# Patient Record
Sex: Female | Born: 1980 | Race: Black or African American | Hispanic: No | Marital: Single | Smoking: Current every day smoker
Health system: Southern US, Community
[De-identification: ages and names within clinical notes are randomized; demographics above are authoritative.]

## PROBLEM LIST (undated history)

## (undated) DIAGNOSIS — M5430 Sciatica, unspecified side: Secondary | ICD-10-CM

## (undated) DIAGNOSIS — I10 Essential (primary) hypertension: Secondary | ICD-10-CM

## (undated) DIAGNOSIS — L02416 Cutaneous abscess of left lower limb: Secondary | ICD-10-CM

## (undated) HISTORY — PX: KNEE SURGERY: SHX244

---

## 2017-02-05 DIAGNOSIS — M5442 Lumbago with sciatica, left side: Secondary | ICD-10-CM | POA: Insufficient documentation

## 2017-02-05 DIAGNOSIS — G8929 Other chronic pain: Secondary | ICD-10-CM | POA: Insufficient documentation

## 2019-08-15 DIAGNOSIS — M25562 Pain in left knee: Secondary | ICD-10-CM | POA: Insufficient documentation

## 2019-08-15 DIAGNOSIS — L02416 Cutaneous abscess of left lower limb: Secondary | ICD-10-CM | POA: Insufficient documentation

## 2019-08-21 DIAGNOSIS — L02415 Cutaneous abscess of right lower limb: Secondary | ICD-10-CM | POA: Insufficient documentation

## 2020-02-02 DIAGNOSIS — R200 Anesthesia of skin: Secondary | ICD-10-CM | POA: Insufficient documentation

## 2020-08-18 ENCOUNTER — Other Ambulatory Visit: Payer: Self-pay

## 2020-08-18 ENCOUNTER — Encounter (HOSPITAL_BASED_OUTPATIENT_CLINIC_OR_DEPARTMENT_OTHER): Payer: Self-pay | Admitting: Emergency Medicine

## 2020-08-18 ENCOUNTER — Emergency Department (HOSPITAL_BASED_OUTPATIENT_CLINIC_OR_DEPARTMENT_OTHER)
Admission: EM | Admit: 2020-08-18 | Discharge: 2020-08-18 | Disposition: A | Discharge status: Home / Self Care | Payer: Medicaid Other | Attending: Emergency Medicine | Admitting: Emergency Medicine

## 2020-08-18 ENCOUNTER — Emergency Department (HOSPITAL_BASED_OUTPATIENT_CLINIC_OR_DEPARTMENT_OTHER): Payer: Medicaid Other

## 2020-08-18 DIAGNOSIS — F17210 Nicotine dependence, cigarettes, uncomplicated: Secondary | ICD-10-CM | POA: Insufficient documentation

## 2020-08-18 DIAGNOSIS — R0789 Other chest pain: Secondary | ICD-10-CM | POA: Insufficient documentation

## 2020-08-18 DIAGNOSIS — R079 Chest pain, unspecified: Secondary | ICD-10-CM

## 2020-08-18 HISTORY — DX: Cutaneous abscess of left lower limb: L02.416

## 2020-08-18 LAB — CBC WITH DIFFERENTIAL/PLATELET
Abs Immature Granulocytes: 0.01 10*3/uL (ref 0.00–0.07)
Basophils Absolute: 0 10*3/uL (ref 0.0–0.1)
Basophils Relative: 0 %
Eosinophils Absolute: 0.4 10*3/uL (ref 0.0–0.5)
Eosinophils Relative: 7 %
HCT: 31.4 % — ABNORMAL LOW (ref 36.0–46.0)
Hemoglobin: 9.1 g/dL — ABNORMAL LOW (ref 12.0–15.0)
Immature Granulocytes: 0 %
Lymphocytes Relative: 18 %
Lymphs Abs: 1 10*3/uL (ref 0.7–4.0)
MCH: 20.5 pg — ABNORMAL LOW (ref 26.0–34.0)
MCHC: 29 g/dL — ABNORMAL LOW (ref 30.0–36.0)
MCV: 70.9 fL — ABNORMAL LOW (ref 80.0–100.0)
Monocytes Absolute: 0.6 10*3/uL (ref 0.1–1.0)
Monocytes Relative: 10 %
Neutro Abs: 3.6 10*3/uL (ref 1.7–7.7)
Neutrophils Relative %: 65 %
Platelets: 511 10*3/uL — ABNORMAL HIGH (ref 150–400)
RBC: 4.43 MIL/uL (ref 3.87–5.11)
RDW: 18.7 % — ABNORMAL HIGH (ref 11.5–15.5)
WBC: 5.5 10*3/uL (ref 4.0–10.5)
nRBC: 0 % (ref 0.0–0.2)

## 2020-08-18 LAB — BASIC METABOLIC PANEL
Anion gap: 8 (ref 5–15)
BUN: 9 mg/dL (ref 6–20)
CO2: 26 mmol/L (ref 22–32)
Calcium: 8.6 mg/dL — ABNORMAL LOW (ref 8.9–10.3)
Chloride: 105 mmol/L (ref 98–111)
Creatinine, Ser: 0.76 mg/dL (ref 0.44–1.00)
GFR, Estimated: >60 mL/min (ref >60)
Glucose, Bld: 91 mg/dL (ref 70–99)
Potassium: 3.2 mmol/L — ABNORMAL LOW (ref 3.5–5.1)
Sodium: 139 mmol/L (ref 135–145)

## 2020-08-18 LAB — TROPONIN I (HIGH SENSITIVITY): Troponin I (High Sensitivity): 5 ng/L (ref <18)

## 2020-08-18 MED ORDER — KETOROLAC TROMETHAMINE 30 MG/ML IJ SOLN
30.0000 mg | Freq: Once | INTRAMUSCULAR | Status: AC
  Administered 2020-08-18: 30 mg via INTRAVENOUS
  Filled 2020-08-18: qty 1

## 2020-08-18 MED ORDER — POTASSIUM CHLORIDE CRYS ER 20 MEQ PO TBCR
20.0000 meq | EXTENDED_RELEASE_TABLET | Freq: Once | ORAL | Status: AC
  Administered 2020-08-18: 20 meq via ORAL
  Filled 2020-08-18: qty 1

## 2020-08-18 NOTE — ED Triage Notes (Signed)
Pt reports left sided chest pain for last week. Pt reports pain is worse with movement. Nad noted.

## 2020-08-18 NOTE — ED Provider Notes (Signed)
[facility] EMERGENCY DEPARTMENT Provider Note   CSN: [id number] Arrival date & time: 08/18/20  0845     History Chief Complaint  Patient presents with   Chest Pain    Michelle Duncan is a 40 y.o. female.  She is here with complaint of left lateral chest pain worse with movement and taking a deep breath.  Denies short of breath nausea or vomiting.  No prior history of cardiac disease.  No known trauma.  She does admit to tobacco and cocaine.  Does have family history of cardiac disease in her mother.  The history is provided by the patient.  Chest Pain Pain location:  L lateral chest Pain quality: sharp and stabbing   Pain radiates to:  Does not radiate Pain severity:  Severe Onset quality:  Gradual Duration:  1 week Timing:  Intermittent Progression:  Unchanged Chronicity:  New Context: raising an arm   Relieved by:  None tried Worsened by:  Movement Ineffective treatments:  None tried Associated symptoms: no abdominal pain, no back pain, no cough, no diaphoresis, no fever, no headache, no nausea, no shortness of breath and no vomiting   Risk factors: smoking       Past Medical History:  Diagnosis Date   Abscess of left leg     Patient Active Problem List   Diagnosis Date Noted   Right leg numbness 02/02/2020   Abscess of leg, right 08/21/2019   Abscess of left lower leg 08/15/2019   Left knee pain 08/15/2019   Chronic left-sided low back pain with left-sided sciatica 02/05/2017    History reviewed. No pertinent surgical history.   OB History   No obstetric history on file.     History reviewed. No pertinent family history.  Social History   Tobacco Use   Smoking status: Every Day    Packs/day: 0.50    Types: Cigarettes   Smokeless tobacco: Never  Vaping Use   Vaping Use: Never used  Substance Use Topics   Alcohol use: Not Currently   Drug use: Yes    Types: Cocaine    Comment: last use 08/17/20    Home Medications Prior to  Admission medications   Medication Sig Start Date End Date Taking? Authorizing Provider  lisinopril-hydrochlorothiazide (ZESTORETIC) 10-12.5 MG tablet Take 1 tablet by mouth daily. 05/22/16   [provider]  sertraline (ZOLOFT) 50 MG tablet TAKE ONE AND ONE-HALF TABLETS BY MOUTH ONE TIME DAILY AT BEDTIME DO NOT DRIVE OR OPERATE MACHINERY IF MEDICATION CAUSES DROWSINESS DO NOT [place] 08/04/19   [provider]    Allergies    Hydrocodone-acetaminophen  Review of Systems   Review of Systems  Constitutional:  Negative for diaphoresis and fever.  HENT:  Negative for sore throat.   Eyes:  Negative for visual disturbance.  Respiratory:  Negative for cough and shortness of breath.   Cardiovascular:  Positive for chest pain.  Gastrointestinal:  Negative for abdominal pain, nausea and vomiting.  Genitourinary:  Negative for dysuria.  Musculoskeletal:  Negative for back pain.  Skin:  Negative for rash.  Neurological:  Negative for headaches.   Physical Exam Updated Vital Signs BP (!) 168/102 (BP Location: Right Arm)   Pulse 81   Temp 98.8 F (37.1 C) (Oral)   Resp (!) 21   Ht 5\' 10"  (1.778 m)   Wt 99.8 kg   LMP 08/18/2020 (Exact Date)   SpO2 100%   BMI 31.57 kg/m   Physical Exam Vitals and nursing note  reviewed.  Constitutional:      General: She is not in acute distress.    Appearance: She is well-developed.  HENT:     Head: Normocephalic and atraumatic.  Eyes:     Conjunctiva/sclera: Conjunctivae normal.  Cardiovascular:     Rate and Rhythm: Normal rate and regular rhythm.     Heart sounds: No murmur heard. Pulmonary:     Effort: Pulmonary effort is normal. No respiratory distress.     Breath sounds: Normal breath sounds.  Chest:     Chest wall: Tenderness present.    Abdominal:     Palpations: Abdomen is soft.     Tenderness: There is no abdominal tenderness.  Musculoskeletal:        General: No deformity or signs of injury. Normal range of motion.      Cervical back: Neck supple.  Skin:    General: Skin is warm and dry.  Neurological:     General: No focal deficit present.     Mental Status: She is alert.    ED Results / Procedures / Treatments   Labs (all labs ordered are listed, but only abnormal results are displayed) Labs Reviewed  BASIC METABOLIC PANEL - Abnormal; Notable for the following components:      Result Value   Potassium 3.2 (*)    Calcium 8.6 (*)    All other components within normal limits  CBC WITH DIFFERENTIAL/PLATELET - Abnormal; Notable for the following components:   Hemoglobin 9.1 (*)    HCT 31.4 (*)    MCV 70.9 (*)    MCH 20.5 (*)    MCHC 29.0 (*)    RDW 18.7 (*)    Platelets 511 (*)    All other components within normal limits  TROPONIN I (HIGH SENSITIVITY)    EKG EKG Interpretation  Date/Time:  Sunday August 18 2020 08:58:25 EDT Ventricular Rate:  84 PR Interval:  131 QRS Duration: 87 QT Interval:  377 QTC Calculation: 446 R Axis:   82 Text Interpretation: Sinus rhythm Consider left ventricular hypertrophy ST elev, probable normal early repol pattern Baseline wander in lead(s) V1 No old tracing to compare Confirmed by Meridee Score [phone]) on 08/18/2020 9:09:44 AM  Radiology DG Chest Port 1 View  Result Date: 08/18/2020 CLINICAL DATA:  Chest pain EXAM: PORTABLE CHEST 1 VIEW COMPARISON:  None. FINDINGS: Normal heart size. Normal mediastinal contour. No pneumothorax. No pleural effusion. Lungs appear clear, with no acute consolidative airspace disease and no pulmonary edema. IMPRESSION: No active disease. Electronically Signed   By: Delbert Phenix M.D.   On: 08/18/2020 10:03    Procedures Procedures   Medications Ordered in ED Medications - No data to display  ED Course  I have reviewed the triage vital signs and the nursing notes.  Pertinent labs & imaging results that were available during my care of the patient were reviewed by me and considered in my medical decision making (see chart  for details).  Clinical Course as of 08/19/20 0845  Wynelle Link Aug 18, 2020  1009 Chest x-ray ordered and interpreted by me as no acute infiltrates no pneumothorax. [MB]  1036 Last hemoglobin in Care Everywhere 7/21 was 9.6. [MB]  1214 Patient states her pain is better but not completely resolved.  Reviewed work-up with her.  She is comfortable plan for symptomatic treatment with anti-inflammatories and heating pad.  Return instructions discussed [MB]    Clinical Course User Index [MB] Terrilee Files, MD   MDM Rules/Calculators/A&P  This patient complains of chest pain; this involves an extensive number of treatment Options and is a complaint that carries with it a high risk of complications and Morbidity. The differential includes ACS, pneumonia, pneumothorax, PE, musculoskeletal, reflux  I ordered, reviewed and interpreted labs, which included CBC with normal white count, hemoglobin low no priors to compare with, chemistries fairly normal other than mildly low potassium, troponins flat I ordered medication potassium and Toradol I ordered imaging studies which included chest x-ray and I independently    visualized and interpreted imaging which showed no acute findings  Previous records obtained and reviewed in epic, no recent admissions  After the interventions stated above, I reevaluated the patient and found patient's pain to be improved.  Clinically has musculoskeletal pain.  No indications for further work-up at this time.  Return instructions discussed   Final Clinical Impression(s) / ED Diagnoses Final diagnoses:  Nonspecific chest pain    Rx / DC Orders ED Discharge Orders     None        Terrilee Files, MD 08/19/20 [id number]

## 2020-08-18 NOTE — Discharge Instructions (Addendum)
You were seen in the emergency department for left-sided chest pain.  You had lab work EKG and a chest x-ray that did not show any obvious cause of your symptoms.  This is likely muscular.  Please try ibuprofen and heating pad.  Follow-up with your doctor.  Return to the emergency department for any worsening or concerning symptoms.

## 2020-11-09 ENCOUNTER — Other Ambulatory Visit: Payer: Self-pay

## 2020-11-09 ENCOUNTER — Emergency Department (HOSPITAL_BASED_OUTPATIENT_CLINIC_OR_DEPARTMENT_OTHER)
Admission: EM | Admit: 2020-11-09 | Discharge: 2020-11-09 | Disposition: A | Discharge status: Home / Self Care | Payer: Medicaid Other | Attending: Emergency Medicine | Admitting: Emergency Medicine

## 2020-11-09 DIAGNOSIS — J3489 Other specified disorders of nose and nasal sinuses: Secondary | ICD-10-CM | POA: Insufficient documentation

## 2020-11-09 DIAGNOSIS — H66001 Acute suppurative otitis media without spontaneous rupture of ear drum, right ear: Secondary | ICD-10-CM | POA: Insufficient documentation

## 2020-11-09 DIAGNOSIS — F17210 Nicotine dependence, cigarettes, uncomplicated: Secondary | ICD-10-CM | POA: Insufficient documentation

## 2020-11-09 MED ORDER — AMOXICILLIN 500 MG PO CAPS: 1000.0000 mg | ORAL_CAPSULE | Freq: Two times a day (BID) | ORAL | 0 refills | Status: AC

## 2020-11-09 MED ORDER — IBUPROFEN 800 MG PO TABS
800.0000 mg | ORAL_TABLET | Freq: Once | ORAL | Status: AC
  Administered 2020-11-09: 800 mg via ORAL
  Filled 2020-11-09: qty 1

## 2020-11-09 MED ORDER — AMOXICILLIN 500 MG PO CAPS
1000.0000 mg | ORAL_CAPSULE | Freq: Once | ORAL | Status: AC
  Administered 2020-11-09: 1000 mg via ORAL
  Filled 2020-11-09: qty 2

## 2020-11-09 NOTE — ED Provider Notes (Signed)
[facility] EMERGENCY DEPARTMENT Provider Note   CSN: [id number] Arrival date & time: 11/09/20  1228     History Chief Complaint  Patient presents with   Facial Pain    Michelle Duncan is a 40 y.o. female.  40 yo F with a chief complaint of right ear pain.  Going on for a couple days.  Has been having some issues with her nose for some time.  Also states that she has been having some right-sided pain.  This right side pain seems to come and go is worse with certain positions and improves with certain positions.  Denies nausea vomiting or diarrhea denies fever denies urinary symptoms denies blood in her urine.  The history is provided by the patient.  Illness Severity:  Moderate Onset quality:  Gradual Duration:  2 days Timing:  Constant Progression:  Worsening Chronicity:  New Associated symptoms: ear pain   Associated symptoms: no chest pain, no congestion, no fever, no headaches, no myalgias, no nausea, no rhinorrhea, no shortness of breath, no vomiting and no wheezing       Past Medical History:  Diagnosis Date   Abscess of left leg     Patient Active Problem List   Diagnosis Date Noted   Right leg numbness 02/02/2020   Abscess of leg, right 08/21/2019   Abscess of left lower leg 08/15/2019   Left knee pain 08/15/2019   Chronic left-sided low back pain with left-sided sciatica 02/05/2017    No past surgical history on file.   OB History   No obstetric history on file.     No family history on file.  Social History   Tobacco Use   Smoking status: Every Day    Packs/day: 0.50    Types: Cigarettes   Smokeless tobacco: Never  Vaping Use   Vaping Use: Never used  Substance Use Topics   Alcohol use: Not Currently   Drug use: Yes    Types: Cocaine    Comment: last use 08/17/20    Home Medications Prior to Admission medications   Medication Sig Start Date End Date Taking? Authorizing Provider  amoxicillin (AMOXIL) 500 MG capsule Take 2  capsules (1,000 mg total) by mouth 2 (two) times daily for 7 days. 11/09/20 11/16/20 Yes Melene Plan, DO  lisinopril-hydrochlorothiazide (ZESTORETIC) 10-12.5 MG tablet Take 1 tablet by mouth daily. 05/22/16   [provider]  sertraline (ZOLOFT) 50 MG tablet TAKE ONE AND ONE-HALF TABLETS BY MOUTH ONE TIME DAILY AT BEDTIME DO NOT DRIVE OR OPERATE MACHINERY IF MEDICATION CAUSES DROWSINESS DO NOT [place] 08/04/19   [provider]    Allergies    Hydrocodone-acetaminophen  Review of Systems   Review of Systems  Constitutional:  Negative for chills and fever.  HENT:  Positive for ear pain. Negative for congestion and rhinorrhea.   Eyes:  Negative for redness and visual disturbance.  Respiratory:  Negative for shortness of breath and wheezing.   Cardiovascular:  Negative for chest pain and palpitations.  Gastrointestinal:  Negative for nausea and vomiting.  Genitourinary:  Positive for flank pain. Negative for dysuria and urgency.  Musculoskeletal:  Negative for arthralgias and myalgias.  Skin:  Negative for pallor and wound.  Neurological:  Negative for dizziness and headaches.   Physical Exam Updated Vital Signs BP (!) 191/115 (BP Location: Right Arm)   Pulse 91   Temp 98.6 F (37 C) (Oral)   Resp 18   Ht 5\' 7"  (1.702 m)   Wt 82.6  kg   LMP 11/09/2020   SpO2 100%   BMI 28.52 kg/m   Physical Exam Vitals and nursing note reviewed.  Constitutional:      General: She is not in acute distress.    Appearance: She is well-developed. She is not diaphoretic.  HENT:     Head: Normocephalic and atraumatic.     Nose:     Comments: Nasal septal perforation.  Right TM with purulent effusion erythema and bulging. Eyes:     Pupils: Pupils are equal, round, and reactive to light.  Cardiovascular:     Rate and Rhythm: Normal rate and regular rhythm.     Heart sounds: No murmur heard.   No friction rub. No gallop.  Pulmonary:     Effort: Pulmonary effort is normal.     Breath  sounds: No wheezing or rales.  Abdominal:     General: There is no distension.     Palpations: Abdomen is soft.     Tenderness: There is no abdominal tenderness.     Comments: Benign abdominal exam  Musculoskeletal:        General: No tenderness.     Cervical back: Normal range of motion and neck supple.  Skin:    General: Skin is warm and dry.  Neurological:     Mental Status: She is alert and oriented to person, place, and time.  Psychiatric:        Behavior: Behavior normal.    ED Results / Procedures / Treatments   Labs (all labs ordered are listed, but only abnormal results are displayed) Labs Reviewed - No data to display  EKG None  Radiology No results found.  Procedures Procedures   Medications Ordered in ED Medications  amoxicillin (AMOXIL) capsule 1,000 mg (1,000 mg Oral Given 11/09/20 1305)  ibuprofen (ADVIL) tablet 800 mg (800 mg Oral Given 11/09/20 1305)    ED Course  I have reviewed the triage vital signs and the nursing notes.  Pertinent labs & imaging results that were available during my care of the patient were reviewed by me and considered in my medical decision making (see chart for details).    MDM Rules/Calculators/A&P                           40 yo F with a chief complaints of right ear pain.  Patient clinically with otitis media will start on antibiotics.  Patient also complaining of a deformity to her nose, found to have nasal septal perforation.  She does snort cocaine and Percocet likely the cause.  Patient also complaining of right side pain sounds musculoskeletal by history and no abdominal discomfort on exam.  We will treat as musculoskeletal.  Have her follow-up with her family doctor.  Given follow-up for ENT as needed.  1:07 PM:  I have discussed the diagnosis/risks/treatment options with the patient and believe the pt to be eligible for discharge home to follow-up with ENT. We also discussed returning to the ED immediately if new or  worsening sx occur. We discussed the sx which are most concerning (e.g., sudden worsening pain, fever, inability to tolerate by mouth) that necessitate immediate return. Medications administered to the patient during their visit and any new prescriptions provided to the patient are listed below.  Medications given during this visit Medications  amoxicillin (AMOXIL) capsule 1,000 mg (1,000 mg Oral Given 11/09/20 1305)  ibuprofen (ADVIL) tablet 800 mg (800 mg Oral Given 11/09/20 1305)  The patient appears reasonably screen and/or stabilized for discharge and I doubt any other medical condition or other [facility] requiring further screening, evaluation, or treatment in the ED at this time prior to discharge.     Final Clinical Impression(s) / ED Diagnoses Final diagnoses:  Acute suppurative otitis media of right ear without spontaneous rupture of tympanic membrane, recurrence not specified  Nasal septal perforation    Rx / DC Orders ED Discharge Orders          Ordered    amoxicillin (AMOXIL) 500 MG capsule  2 times daily        11/09/20 1259             Melene Plan, DO 11/09/20 1307

## 2020-11-09 NOTE — ED Triage Notes (Signed)
Pt states she snorts percocet and cocaine. States cant hear from right ear. Sinus pain x 1 month.

## 2020-11-09 NOTE — Discharge Instructions (Signed)
Take 4 over the counter ibuprofen tablets 3 times a day or 2 over-the-counter naproxen tablets twice a day for pain. Also take tylenol 1000mg(2 extra strength) four times a day.    

## 2021-11-16 ENCOUNTER — Encounter (HOSPITAL_BASED_OUTPATIENT_CLINIC_OR_DEPARTMENT_OTHER): Payer: Self-pay | Admitting: Emergency Medicine

## 2021-11-16 ENCOUNTER — Emergency Department (HOSPITAL_BASED_OUTPATIENT_CLINIC_OR_DEPARTMENT_OTHER)
Admission: EM | Admit: 2021-11-16 | Discharge: 2021-11-16 | Disposition: A | Discharge status: Home / Self Care | Payer: No Typology Code available for payment source | Attending: Emergency Medicine | Admitting: Emergency Medicine

## 2021-11-16 DIAGNOSIS — K0889 Other specified disorders of teeth and supporting structures: Secondary | ICD-10-CM | POA: Diagnosis present

## 2021-11-16 DIAGNOSIS — K047 Periapical abscess without sinus: Secondary | ICD-10-CM | POA: Diagnosis not present

## 2021-11-16 HISTORY — DX: Essential (primary) hypertension: I10

## 2021-11-16 HISTORY — DX: Sciatica, unspecified side: M54.30

## 2021-11-16 MED ORDER — CELECOXIB 200 MG PO CAPS: 200.0000 mg | ORAL_CAPSULE | Freq: Two times a day (BID) | ORAL | 0 refills | Status: DC

## 2021-11-16 MED ORDER — AMOXICILLIN-POT CLAVULANATE 875-125 MG PO TABS: 1.0000 | ORAL_TABLET | Freq: Two times a day (BID) | ORAL | 0 refills | Status: DC

## 2021-11-16 MED ORDER — AMOXICILLIN-POT CLAVULANATE 875-125 MG PO TABS
1.0000 | ORAL_TABLET | Freq: Once | ORAL | Status: AC
  Administered 2021-11-16: 1 via ORAL
  Filled 2021-11-16: qty 1

## 2021-11-16 NOTE — ED Triage Notes (Signed)
Pt c/o pain to RT upper gums since this morning; swelling noted to RT side face

## 2021-11-16 NOTE — ED Provider Notes (Signed)
[facility] Provider Note   CSN: [id number] Arrival date & time: 11/16/21  1710     History  Chief Complaint  Patient presents with   Dental Pain    Michelle Duncan is a 41 y.o. female who presents emergency department with chief complaint of dental pain and some facial swelling.  She had onset of pain and swelling beginning last night.  She noted denies any pain with swallowing, fevers, chills, change in voice.  She has no other complaints at this time  Dental Pain      Home Medications Prior to Admission medications   Medication Sig Start Date End Date Taking? Authorizing Provider  lisinopril-hydrochlorothiazide (ZESTORETIC) 10-12.5 MG tablet Take 1 tablet by mouth daily. 05/22/16   [provider]  sertraline (ZOLOFT) 50 MG tablet TAKE ONE AND ONE-HALF TABLETS BY MOUTH ONE TIME DAILY AT BEDTIME DO NOT DRIVE OR OPERATE MACHINERY IF MEDICATION CAUSES DROWSINESS DO NOT [place] 08/04/19   [provider]      Allergies    Hydrocodone-acetaminophen    Review of Systems   Review of Systems  Physical Exam Updated Vital Signs BP (!) 156/87 (BP Location: Right Arm)   Pulse 94   Temp 99 F (37.2 C) (Oral)   Resp 18   Ht 5\' 7"  (1.702 m)   Wt 63.5 kg   LMP 11/12/2021   SpO2 100%   BMI 21.93 kg/m  Physical Exam Vitals and nursing note reviewed.  Constitutional:      General: She is not in acute distress.    Appearance: She is well-developed. She is not diaphoretic.  HENT:     Head: Normocephalic and atraumatic.     Comments: Poor dentition, facial swelling on the right.  Gum erythema above the molars on the upper right side.    Right Ear: External ear normal.     Left Ear: External ear normal.     Nose: Nose normal.     Mouth/Throat:     Mouth: Mucous membranes are moist.  Eyes:     General: No scleral icterus.    Conjunctiva/sclera: Conjunctivae normal.  Cardiovascular:     Rate and Rhythm: Normal rate and regular  rhythm.     Heart sounds: Normal heart sounds. No murmur heard.    No friction rub. No gallop.  Pulmonary:     Effort: Pulmonary effort is normal. No respiratory distress.     Breath sounds: Normal breath sounds.  Abdominal:     General: Bowel sounds are normal. There is no distension.     Palpations: Abdomen is soft. There is no mass.     Tenderness: There is no abdominal tenderness. There is no guarding.  Musculoskeletal:     Cervical back: Normal range of motion.  Skin:    General: Skin is warm and dry.  Neurological:     Mental Status: She is alert and oriented to person, place, and time.  Psychiatric:        Behavior: Behavior normal.     ED Results / Procedures / Treatments   Labs (all labs ordered are listed, but only abnormal results are displayed) Labs Reviewed - No data to display  EKG None  Radiology No results found.  Procedures Procedures    Medications Ordered in ED Medications - No data to display  ED Course/ Medical Decision Making/ A&P  Medical Decision Making  Patient with toothache.  No gross abscess.  Exam unconcerning for Ludwig's angina or spread of infection.  Will treat with penicillin and pain medicine.  Urged patient to follow-up with dentist.           Final Clinical Impression(s) / ED Diagnoses Final diagnoses:  Dental infection    Rx / DC Orders ED Discharge Orders     None         Margarita Mail, PA-C 11/16/21 1923    Tegeler, Gwenyth Allegra, MD 11/16/21 2027

## 2021-11-16 NOTE — Discharge Instructions (Signed)

## 2021-11-27 ENCOUNTER — Other Ambulatory Visit: Payer: Self-pay

## 2021-11-27 ENCOUNTER — Emergency Department (HOSPITAL_COMMUNITY)
Admission: EM | Admit: 2021-11-27 | Discharge: 2021-11-27 | Disposition: A | Discharge status: Home / Self Care | Payer: No Typology Code available for payment source | Attending: Emergency Medicine | Admitting: Emergency Medicine

## 2021-11-27 ENCOUNTER — Encounter (HOSPITAL_COMMUNITY): Payer: Self-pay

## 2021-11-27 DIAGNOSIS — Z79899 Other long term (current) drug therapy: Secondary | ICD-10-CM | POA: Diagnosis not present

## 2021-11-27 DIAGNOSIS — K0889 Other specified disorders of teeth and supporting structures: Secondary | ICD-10-CM | POA: Diagnosis present

## 2021-11-27 DIAGNOSIS — I10 Essential (primary) hypertension: Secondary | ICD-10-CM | POA: Insufficient documentation

## 2021-11-27 MED ORDER — AMOXICILLIN-POT CLAVULANATE 875-125 MG PO TABS: 1.0000 | ORAL_TABLET | Freq: Two times a day (BID) | ORAL | 0 refills | Status: AC

## 2021-11-27 MED ORDER — CELECOXIB 200 MG PO CAPS: 200.0000 mg | ORAL_CAPSULE | Freq: Two times a day (BID) | ORAL | 0 refills | Status: AC

## 2021-11-27 NOTE — Discharge Instructions (Addendum)
Please take entire course of antibiotics as directed.  Continue using prescribed Celebrex for pain.  You will need to follow-up with a dentist for continued management of this.  Return to the emergency department for fevers, swelling or pain under the tongue or in the neck, difficulty breathing or swallowing or any other new or concerning symptoms.

## 2021-11-27 NOTE — ED Provider Notes (Signed)
[facility] EMERGENCY DEPARTMENT Provider Note   CSN: [id number] Arrival date & time: 11/27/21  1156     History  Chief Complaint  Patient presents with   Dental Pain    Michelle Duncan is a 41 y.o. female.  Michelle Duncan is a 41 y.o. female with a history of hypertension and sciatica, who presents to the ED for evaluation of dental pain.  Patient was seen for the same on 10/22, at that time reported some swelling and right upper dental pain, was prescribed Celebrex and Augmentin.  Patient reports she received the Celebrex prescription from the pharmacy but was never given Augmentin.  She reports Celebrex helped with her pain but she has since run out of it and now has had continued swelling and pain over the right upper molars.  She reports she has had some intermittent facial swelling.  Pain worse at night with some throbbing.  No fevers, no vomiting, no difficulty swallowing.  No pain or swelling under the tongue.  Try to go and see a dentist today but is waiting on an insurance issue.  No other aggravating or alleviating factors.  The history is provided by the patient and medical records.  Dental Pain Associated symptoms: facial swelling   Associated symptoms: no fever        Home Medications Prior to Admission medications   Medication Sig Start Date End Date Taking? Authorizing Provider  amoxicillin-clavulanate (AUGMENTIN) 875-125 MG tablet Take 1 tablet by mouth every 12 (twelve) hours. 11/27/21   Dartha Lodge, PA-C  celecoxib (CELEBREX) 200 MG capsule Take 1 capsule (200 mg total) by mouth 2 (two) times daily. 11/27/21   Dartha Lodge, PA-C  lisinopril-hydrochlorothiazide (ZESTORETIC) 10-12.5 MG tablet Take 1 tablet by mouth daily. 05/22/16   [provider]  sertraline (ZOLOFT) 50 MG tablet TAKE ONE AND ONE-HALF TABLETS BY MOUTH ONE TIME DAILY AT BEDTIME DO NOT DRIVE OR OPERATE MACHINERY IF MEDICATION CAUSES DROWSINESS DO NOT [place] 08/04/19   [provider]      Allergies    Hydrocodone-acetaminophen    Review of Systems   Review of Systems  Constitutional:  Negative for chills and fever.  HENT:  Positive for dental problem and facial swelling.     Physical Exam Updated Vital Signs BP 134/75 (BP Location: Right Arm)   Pulse (!) 101   Temp 98.7 F (37.1 C) (Oral)   Resp 18   Ht 5\' 7"  (1.702 m)   Wt 63.5 kg   LMP 11/12/2021   SpO2 100%   BMI 21.93 kg/m  Physical Exam Vitals and nursing note reviewed.  Constitutional:      General: She is not in acute distress.    Appearance: Normal appearance. She is well-developed. She is not ill-appearing or diaphoretic.  HENT:     Head: Normocephalic and atraumatic.     Comments: No significant facial swelling noted    Mouth/Throat:     Comments: Tenderness over the right upper molars with some swelling and erythema of the gums but no obvious drainable abscess, no swelling or tenderness over the roof of the mouth, posterior oropharynx clear, no trismus, no torticollis, normal phonation.  No pain or swelling under the tongue. Eyes:     General:        Right eye: No discharge.        Left eye: No discharge.  Pulmonary:     Effort: Pulmonary effort is normal. No respiratory distress.  Neurological:  Mental Status: She is alert and oriented to person, place, and time.     Coordination: Coordination normal.  Psychiatric:        Mood and Affect: Mood normal.        Behavior: Behavior normal.     ED Results / Procedures / Treatments   Labs (all labs ordered are listed, but only abnormal results are displayed) Labs Reviewed - No data to display  EKG None  Radiology No results found.  Procedures Procedures    Medications Ordered in ED Medications - No data to display  ED Course/ Medical Decision Making/ A&P                           Medical Decision Making Risk Prescription drug management.   Patient with toothache.  No gross abscess.  Exam  unconcerning for Ludwig's angina or spread of infection.  Seen previously but did not receive antibiotics as prescribed.  Will prescribe Augmentin and refill NSAID prescription.  Provided dental resources and stressed the importance of outpatient dental follow-up.  Patient expresses understanding and agreement with plan.  Discharged home in good condition.        Final Clinical Impression(s) / ED Diagnoses Final diagnoses:  Pain, dental    Rx / DC Orders ED Discharge Orders          Ordered    amoxicillin-clavulanate (AUGMENTIN) 875-125 MG tablet  Every 12 hours        11/27/21 1330    celecoxib (CELEBREX) 200 MG capsule  2 times daily        11/27/21 1330              Jacqlyn Larsen, [place] 11/27/21 1353    Cristie Hem, MD 11/28/21 [phone]

## 2021-11-27 NOTE — ED Triage Notes (Signed)
Patient reports went to [facility] and was prescribed celebrex for her pain in right upper gum on 10/22 returns with increased pain to right upper gum with possible abscess.

## 2021-11-27 NOTE — ED Notes (Signed)
Multiple apple juices given per pt request.

## 2022-11-12 IMAGING — DX DG CHEST 1V PORT
1 series · 1 of 1 positions shown · non-contrast
Comparison: None.

CLINICAL DATA: Chest pain

EXAM:
PORTABLE CHEST 1 VIEW

[chest ap]
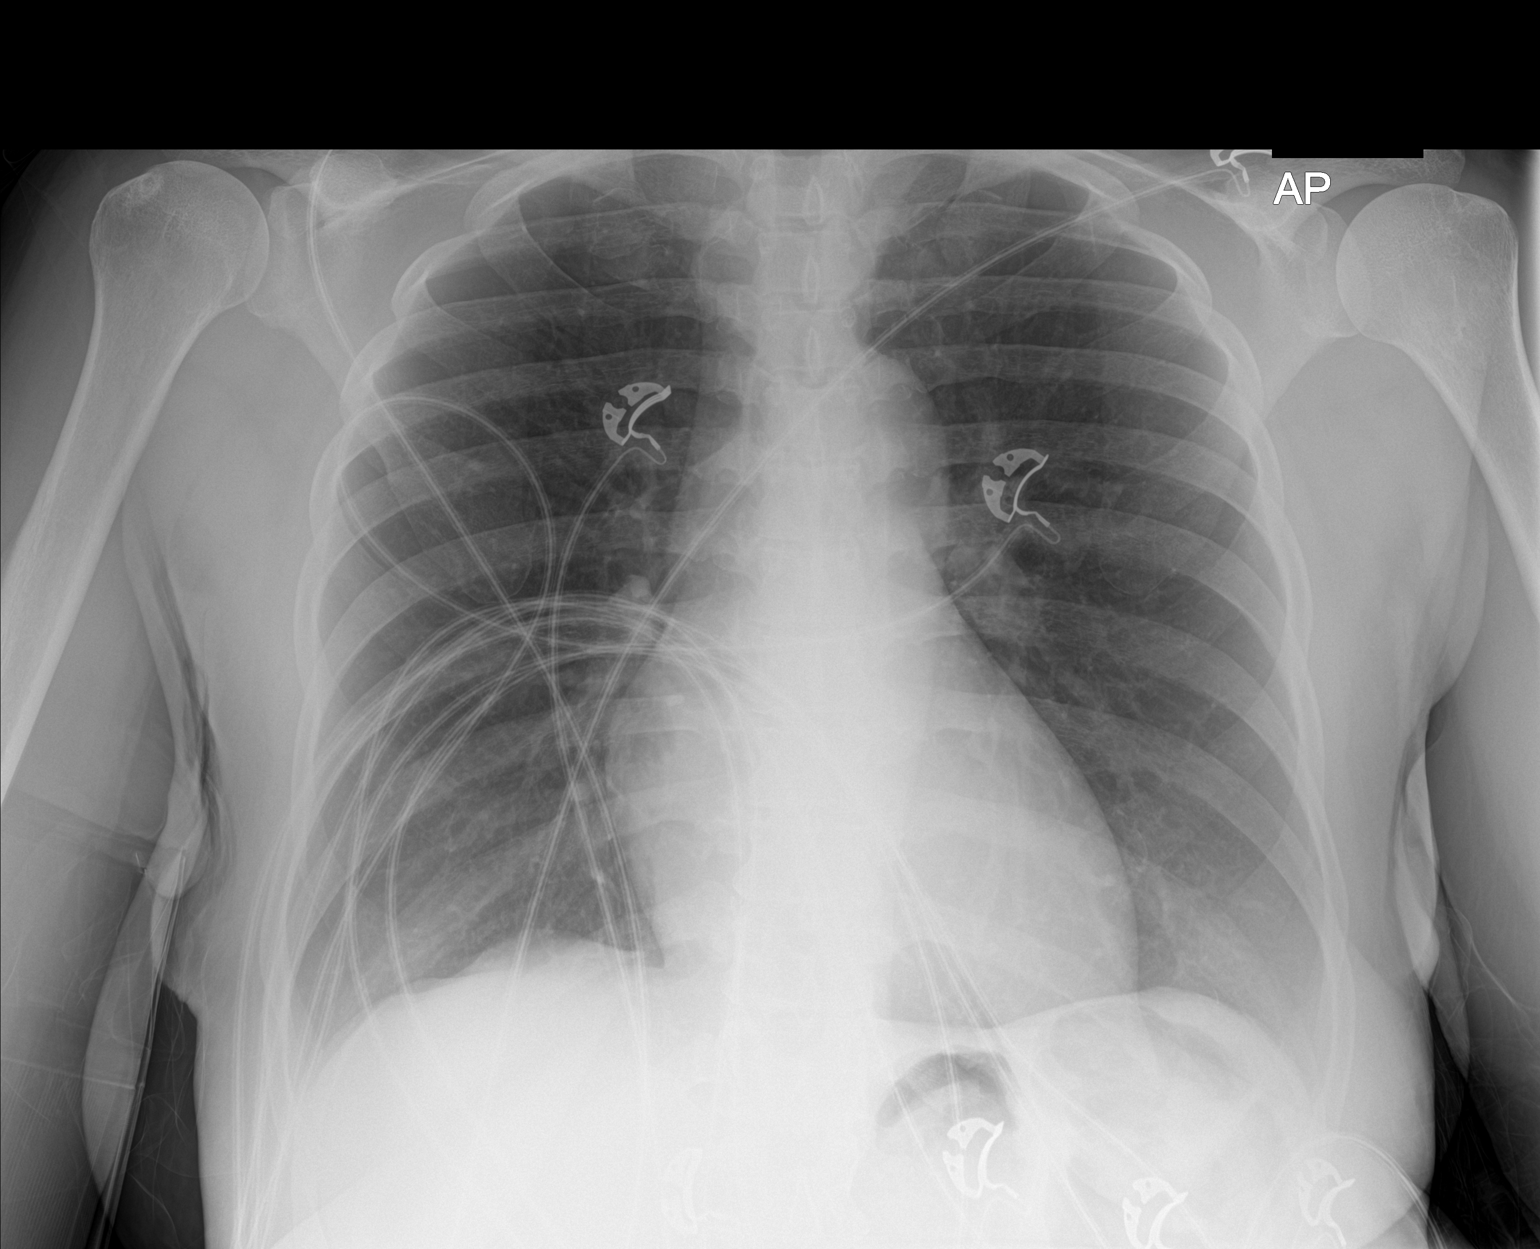

[1 of 1 positions shown; findings below may reference images not displayed]

FINDINGS: Normal heart size. Normal mediastinal contour. No pneumothorax. No
pleural effusion. Lungs appear clear, with no acute consolidative
airspace disease and no pulmonary edema.
IMPRESSION: No active disease.

## 2023-04-08 ENCOUNTER — Other Ambulatory Visit: Payer: Self-pay

## 2023-04-08 ENCOUNTER — Encounter (HOSPITAL_BASED_OUTPATIENT_CLINIC_OR_DEPARTMENT_OTHER): Payer: Self-pay

## 2023-04-08 DIAGNOSIS — Z79899 Other long term (current) drug therapy: Secondary | ICD-10-CM | POA: Diagnosis not present

## 2023-04-08 DIAGNOSIS — N939 Abnormal uterine and vaginal bleeding, unspecified: Secondary | ICD-10-CM | POA: Diagnosis present

## 2023-04-08 NOTE — ED Triage Notes (Addendum)
 Pt here for vaginal bleeding Pt states she has been "spotting" and cramping Went to the [facility] for a BM and large clots came out Went to [facility] today and had blood work and urine tested/ left because of the wait Results are able to view Thinks she may have had a miscarriage Pt does have a picture on her phone of clot

## 2023-04-09 ENCOUNTER — Emergency Department (HOSPITAL_BASED_OUTPATIENT_CLINIC_OR_DEPARTMENT_OTHER)
Admission: EM | Admit: 2023-04-09 | Discharge: 2023-04-09 | Disposition: A | Discharge status: Home / Self Care | Attending: Emergency Medicine | Admitting: Emergency Medicine

## 2023-04-09 ENCOUNTER — Other Ambulatory Visit (HOSPITAL_BASED_OUTPATIENT_CLINIC_OR_DEPARTMENT_OTHER): Payer: Self-pay | Admitting: Emergency Medicine

## 2023-04-09 ENCOUNTER — Ambulatory Visit (HOSPITAL_BASED_OUTPATIENT_CLINIC_OR_DEPARTMENT_OTHER)
Admission: RE | Admit: 2023-04-09 | Discharge: 2023-04-09 | Disposition: A | Discharge status: Home / Self Care | Source: Ambulatory Visit | Attending: Emergency Medicine | Admitting: Emergency Medicine

## 2023-04-09 DIAGNOSIS — N939 Abnormal uterine and vaginal bleeding, unspecified: Secondary | ICD-10-CM

## 2023-04-09 NOTE — ED Provider Notes (Signed)
 [facility] Provider Note   CSN: [id number] Arrival date & time: 04/08/23  1958     History  Chief Complaint  Patient presents with   Vaginal Bleeding    Michelle Duncan is a 43 y.o. female.  Patient is a 43 year old female with history of anemia.  Patient presenting today with complaints of vaginal bleeding.  Earlier today, she felt as though she had to have a bowel movement.  She went to the bathroom, then passed a large blood clot.  She describes some lower abdominal discomfort and is concerned she may be having a miscarriage.  No aggravating or alleviating factors.  Patient was initially seen at [facility], had laboratory studies obtained, then left due to prolonged wait times.  The history is provided by the patient.       Home Medications Prior to Admission medications   Medication Sig Start Date End Date Taking? Authorizing Provider  amoxicillin-clavulanate (AUGMENTIN) 875-125 MG tablet Take 1 tablet by mouth every 12 (twelve) hours. 11/27/21   Rosezella Rumpf, PA-C  celecoxib (CELEBREX) 200 MG capsule Take 1 capsule (200 mg total) by mouth 2 (two) times daily. 11/27/21   Rosezella Rumpf, PA-C  lisinopril-hydrochlorothiazide (ZESTORETIC) 10-12.5 MG tablet Take 1 tablet by mouth daily. 05/22/16   [provider]  sertraline (ZOLOFT) 50 MG tablet TAKE ONE AND ONE-HALF TABLETS BY MOUTH ONE TIME DAILY AT BEDTIME DO NOT DRIVE OR OPERATE MACHINERY IF MEDICATION CAUSES DROWSINESS DO NOT [place] 08/04/19   [provider]      Allergies    Hydrocodone-acetaminophen    Review of Systems   Review of Systems  All other systems reviewed and are negative.   Physical Exam Updated Vital Signs BP (!) 147/90 (BP Location: Left Arm)   Pulse 87   Temp 98.7 F (37.1 C)   Resp 17   Ht 5\' 6"  (1.676 m)   Wt 72.6 kg   LMP 03/16/2023 (Exact Date)   SpO2 96%   BMI 25.82 kg/m  Physical Exam Vitals and nursing note  reviewed.  Constitutional:      General: She is not in acute distress.    Appearance: She is well-developed. She is not diaphoretic.  HENT:     Head: Normocephalic and atraumatic.  Cardiovascular:     Rate and Rhythm: Normal rate and regular rhythm.     Heart sounds: No murmur heard.    No friction rub. No gallop.  Pulmonary:     Effort: Pulmonary effort is normal. No respiratory distress.     Breath sounds: Normal breath sounds. No wheezing.  Abdominal:     General: Bowel sounds are normal. There is no distension.     Palpations: Abdomen is soft.     Tenderness: There is no abdominal tenderness.  Musculoskeletal:        General: Normal range of motion.     Cervical back: Normal range of motion and neck supple.  Skin:    General: Skin is warm and dry.  Neurological:     General: No focal deficit present.     Mental Status: She is alert and oriented to person, place, and time.     ED Results / Procedures / Treatments   Labs (all labs ordered are listed, but only abnormal results are displayed) Labs Reviewed - No data to display  EKG None  Radiology No results found.  Procedures Procedures    Medications Ordered in ED Medications -  No data to display  ED Course/ Medical Decision Making/ A&P  Patient presenting with vaginal bleeding as described in the HPI.  Patient arrives here with stable vital signs and is afebrile.  Physical examination basically unremarkable.  Laboratory studies from [facility] regional reviewed including CBC, urine and blood pregnancy test, both of which are unremarkable.  Her hemoglobin was 12.4 and pregnancy tests were both negative.  Urinalysis suggestive of UTI, but patient having no urinary symptoms.  She is not actively bleeding at this time and I feel can safely be discharged.  I will arrange an ultrasound for the morning to further evaluate for fibroids or other abnormality.  Patient to be discharged with as needed return.  Final  Clinical Impression(s) / ED Diagnoses Final diagnoses:  None    Rx / DC Orders ED Discharge Orders     None         Geoffery Lyons, MD 04/09/23 [phone]

## 2023-04-09 NOTE — Discharge Instructions (Signed)
 Return tomorrow at the given time for an ultrasound of your pelvis to further evaluate the cause of your bleeding.  Return to the ER in the meantime if symptoms significantly worsen or change.
